# Patient Record
Sex: Male | Born: 1995 | Race: White | Hispanic: No | Marital: Single
Health system: Southern US, Community
[De-identification: ages and names within clinical notes are randomized; demographics above are authoritative.]

---
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
## 2019-04-23 ENCOUNTER — Emergency Department
Admission: EM | Admit: 2019-04-23 | Discharge: 2019-04-23 | Discharge status: Against Medical Advice | Payer: Medicaid Other

## 2021-06-28 ENCOUNTER — Emergency Department: Payer: Medicaid Other

## 2021-06-28 ENCOUNTER — Encounter: Payer: Self-pay | Admitting: Emergency Medicine

## 2021-06-28 ENCOUNTER — Emergency Department
Admission: EM | Admit: 2021-06-28 | Discharge: 2021-06-28 | Disposition: A | Discharge status: Home / Self Care | Payer: Medicaid Other | Attending: Emergency Medicine | Admitting: Emergency Medicine

## 2021-06-28 DIAGNOSIS — M25552 Pain in left hip: Secondary | ICD-10-CM | POA: Insufficient documentation

## 2021-06-28 DIAGNOSIS — R1032 Left lower quadrant pain: Secondary | ICD-10-CM | POA: Diagnosis present

## 2021-06-28 DIAGNOSIS — T148XXA Other injury of unspecified body region, initial encounter: Secondary | ICD-10-CM

## 2021-06-28 DIAGNOSIS — M25512 Pain in left shoulder: Secondary | ICD-10-CM | POA: Insufficient documentation

## 2021-06-28 DIAGNOSIS — M79642 Pain in left hand: Secondary | ICD-10-CM | POA: Insufficient documentation

## 2021-06-28 DIAGNOSIS — Y92410 Unspecified street and highway as the place of occurrence of the external cause: Secondary | ICD-10-CM | POA: Insufficient documentation

## 2021-06-28 MED ORDER — KETOROLAC TROMETHAMINE 30 MG/ML IJ SOLN
30.0000 mg | Freq: Once | INTRAMUSCULAR | Status: AC
  Administered 2021-06-28: 30 mg via INTRAMUSCULAR

## 2021-06-28 MED ORDER — OXYCODONE HCL 5 MG PO TABS
5.0000 mg | ORAL_TABLET | ORAL | Status: AC
  Administered 2021-06-28: 5 mg via ORAL
  Filled 2021-06-28: qty 1

## 2021-06-28 MED ORDER — KETOROLAC TROMETHAMINE 30 MG/ML IJ SOLN
15.0000 mg | Freq: Once | INTRAMUSCULAR | Status: DC
  Filled 2021-06-28: qty 1

## 2021-06-28 MED ORDER — KETOROLAC TROMETHAMINE 30 MG/ML IJ SOLN: 30.0000 mg | Freq: Once | INTRAMUSCULAR | Status: DC

## 2021-06-28 MED ORDER — OXYCODONE HCL 5 MG PO TABS: 5.0000 mg | ORAL_TABLET | Freq: Three times a day (TID) | ORAL | 0 refills | Status: AC | PRN
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
